# Patient Record
Sex: Male | Born: 1975 | Race: White | Hispanic: No | Marital: Single | Smoking: Never smoker
Health system: Southern US, Community
[De-identification: ages and names within clinical notes are randomized; demographics above are authoritative.]

## PROBLEM LIST (undated history)

## (undated) DIAGNOSIS — K859 Acute pancreatitis without necrosis or infection, unspecified: Secondary | ICD-10-CM

## (undated) DIAGNOSIS — I10 Essential (primary) hypertension: Secondary | ICD-10-CM

## (undated) DIAGNOSIS — A4902 Methicillin resistant Staphylococcus aureus infection, unspecified site: Secondary | ICD-10-CM

## (undated) HISTORY — PX: HIP SURGERY: SHX245

---

## 2017-12-29 ENCOUNTER — Emergency Department (HOSPITAL_BASED_OUTPATIENT_CLINIC_OR_DEPARTMENT_OTHER)
Admission: EM | Admit: 2017-12-29 | Discharge: 2017-12-29 | Disposition: A | Discharge status: Home / Self Care | Payer: Medicaid - Out of State | Attending: Emergency Medicine | Admitting: Emergency Medicine

## 2017-12-29 ENCOUNTER — Encounter (HOSPITAL_BASED_OUTPATIENT_CLINIC_OR_DEPARTMENT_OTHER): Payer: Self-pay | Admitting: *Deleted

## 2017-12-29 ENCOUNTER — Other Ambulatory Visit: Payer: Self-pay

## 2017-12-29 ENCOUNTER — Emergency Department (HOSPITAL_BASED_OUTPATIENT_CLINIC_OR_DEPARTMENT_OTHER): Payer: Medicaid - Out of State

## 2017-12-29 DIAGNOSIS — I10 Essential (primary) hypertension: Secondary | ICD-10-CM | POA: Diagnosis not present

## 2017-12-29 DIAGNOSIS — R1033 Periumbilical pain: Secondary | ICD-10-CM | POA: Diagnosis present

## 2017-12-29 DIAGNOSIS — Z79899 Other long term (current) drug therapy: Secondary | ICD-10-CM | POA: Insufficient documentation

## 2017-12-29 DIAGNOSIS — K852 Alcohol induced acute pancreatitis without necrosis or infection: Secondary | ICD-10-CM | POA: Diagnosis not present

## 2017-12-29 HISTORY — DX: Methicillin resistant Staphylococcus aureus infection, unspecified site: A49.02

## 2017-12-29 HISTORY — DX: Essential (primary) hypertension: I10

## 2017-12-29 HISTORY — DX: Acute pancreatitis without necrosis or infection, unspecified: K85.90

## 2017-12-29 LAB — CBC WITH DIFFERENTIAL/PLATELET
BASOS ABS: 0 10*3/uL (ref 0.0–0.1)
BASOS PCT: 0 %
EOS ABS: 0.1 10*3/uL (ref 0.0–0.7)
Eosinophils Relative: 1 %
HCT: 41.4 % (ref 39.0–52.0)
HEMOGLOBIN: 15 g/dL (ref 13.0–17.0)
LYMPHS ABS: 1.2 10*3/uL (ref 0.7–4.0)
Lymphocytes Relative: 6 %
MCH: 33.4 pg (ref 26.0–34.0)
MCHC: 36.2 g/dL — ABNORMAL HIGH (ref 30.0–36.0)
MCV: 92.2 fL (ref 78.0–100.0)
Monocytes Absolute: 1.1 10*3/uL — ABNORMAL HIGH (ref 0.1–1.0)
Monocytes Relative: 5 %
NEUTROS PCT: 88 %
Neutro Abs: 17.4 10*3/uL — ABNORMAL HIGH (ref 1.7–7.7)
Platelets: 234 10*3/uL (ref 150–400)
RBC: 4.49 MIL/uL (ref 4.22–5.81)
RDW: 14.2 % (ref 11.5–15.5)
WBC: 19.8 10*3/uL — ABNORMAL HIGH (ref 4.0–10.5)

## 2017-12-29 LAB — COMPREHENSIVE METABOLIC PANEL
ALBUMIN: 3.8 g/dL (ref 3.5–5.0)
ALK PHOS: 128 U/L — AB (ref 38–126)
ALT: 110 U/L — AB (ref 17–63)
AST: 85 U/L — ABNORMAL HIGH (ref 15–41)
Anion gap: 16 — ABNORMAL HIGH (ref 5–15)
BUN: 7 mg/dL (ref 6–20)
CALCIUM: 9.1 mg/dL (ref 8.9–10.3)
CO2: 23 mmol/L (ref 22–32)
CREATININE: 0.62 mg/dL (ref 0.61–1.24)
Chloride: 95 mmol/L — ABNORMAL LOW (ref 101–111)
GFR calc Af Amer: >60 mL/min (ref >60)
GFR calc non Af Amer: >60 mL/min (ref >60)
GLUCOSE: 92 mg/dL (ref 65–99)
Potassium: 3.5 mmol/L (ref 3.5–5.1)
SODIUM: 134 mmol/L — AB (ref 135–145)
Total Bilirubin: 1.5 mg/dL — ABNORMAL HIGH (ref 0.3–1.2)
Total Protein: 7.7 g/dL (ref 6.5–8.1)

## 2017-12-29 LAB — LIPASE, BLOOD: Lipase: 112 U/L — ABNORMAL HIGH (ref 11–51)

## 2017-12-29 MED ORDER — IOPAMIDOL (ISOVUE-300) INJECTION 61%
100.0000 mL | Freq: Once | INTRAVENOUS | Status: AC | PRN
  Administered 2017-12-29: 100 mL via INTRAVENOUS

## 2017-12-29 MED ORDER — GI COCKTAIL ~~LOC~~
30.0000 mL | Freq: Once | ORAL | Status: AC
  Administered 2017-12-29: 30 mL via ORAL
  Filled 2017-12-29: qty 30

## 2017-12-29 MED ORDER — OXYCODONE-ACETAMINOPHEN 5-325 MG PO TABS: 1.0000 | ORAL_TABLET | Freq: Four times a day (QID) | ORAL | 0 refills | Status: AC | PRN

## 2017-12-29 MED ORDER — FENTANYL CITRATE (PF) 100 MCG/2ML IJ SOLN
100.0000 ug | Freq: Once | INTRAMUSCULAR | Status: AC
  Administered 2017-12-29: 100 ug via INTRAVENOUS
  Filled 2017-12-29: qty 2

## 2017-12-29 MED ORDER — KETOROLAC TROMETHAMINE 30 MG/ML IJ SOLN
15.0000 mg | Freq: Once | INTRAMUSCULAR | Status: AC
Start: 2017-12-29 — End: 2017-12-29
  Administered 2017-12-29: 15 mg via INTRAVENOUS
  Filled 2017-12-29: qty 1

## 2017-12-29 MED ORDER — ONDANSETRON HCL 4 MG/2ML IJ SOLN
4.0000 mg | Freq: Once | INTRAMUSCULAR | Status: AC
  Administered 2017-12-29: 4 mg via INTRAVENOUS
  Filled 2017-12-29: qty 2

## 2017-12-29 MED ORDER — ONDANSETRON 8 MG PO TBDP: ORAL_TABLET | ORAL | 0 refills | Status: AC

## 2017-12-29 MED ORDER — OMEPRAZOLE 20 MG PO CPDR: 20.0000 mg | DELAYED_RELEASE_CAPSULE | Freq: Every day | ORAL | 0 refills | Status: AC

## 2017-12-29 NOTE — ED Provider Notes (Addendum)
[facility] EMERGENCY DEPARTMENT Provider Note   CSN: [id number] Arrival date & time: 12/29/17  0245     History   Chief Complaint Chief Complaint  Patient presents with  . Abdominal Pain    HPI Samuel Berg is a 42 y.o. male.  The history is provided by the patient. No language interpreter was used.  Abdominal Pain   The current episode started more than 2 days ago. The problem occurs constantly. The problem has not changed since onset.The pain is associated with alcohol use. The pain is located in the periumbilical region. The quality of the pain is sharp. The pain is at a severity of 10/10. The pain is severe. Associated symptoms include nausea and vomiting. Pertinent negatives include fever and diarrhea. Nothing aggravates the symptoms. Nothing relieves the symptoms. Past workup does not include CT scan. His past medical history does not include PUD or ulcerative colitis.    Past Medical History:  Diagnosis Date  . Hypertension   . MRSA infection   . Pancreatitis     There are no active problems to display for this patient.   Past Surgical History:  Procedure Laterality Date  . HIP SURGERY          Home Medications    Prior to Admission medications   Medication Sig Start Date End Date Taking? Authorizing Provider  Atorvastatin Calcium (LIPITOR PO) Take by mouth.   Yes [provider]  Esomeprazole Magnesium (NEXIUM PO) Take by mouth.   Yes [provider]  LISINOPRIL PO Take by mouth.   Yes [provider]    Family History No family history on file.  Social History Social History   Tobacco Use  . Smoking status: Never Smoker  . Smokeless tobacco: Never Used  Substance Use Topics  . Alcohol use: Yes    Comment: daily   . Drug use: Never     Allergies   Patient has no known allergies.   Review of Systems Review of Systems  Constitutional: Negative for diaphoresis and fever.  Respiratory: Negative for  shortness of breath.   Cardiovascular: Negative for chest pain, palpitations and leg swelling.  Gastrointestinal: Positive for abdominal pain, nausea and vomiting. Negative for blood in stool and diarrhea.  Genitourinary: Negative for flank pain.  All other systems reviewed and are negative.    Physical Exam Updated Vital Signs BP 136/90   Pulse 86   Temp 100 F (37.8 C) (Oral)   Resp 20   Ht 5\' 9"  (1.753 m)   Wt 104.3 kg (230 lb)   SpO2 97%   BMI 33.97 kg/m   Physical Exam  Constitutional: He is oriented to person, place, and time. He appears well-developed and well-nourished. He does not appear ill. No distress.  HENT:  Head: Normocephalic and atraumatic.  Nose: Nose normal.  Mouth/Throat: No oropharyngeal exudate.  Eyes: Pupils are equal, round, and reactive to light. Conjunctivae are normal.  Neck: Normal range of motion. Neck supple. No JVD present.  Cardiovascular: Normal rate, regular rhythm, normal heart sounds and intact distal pulses.  Pulmonary/Chest: Effort normal and breath sounds normal. No stridor. He has no wheezes. He has no rales.  Abdominal: Soft. Bowel sounds are normal. He exhibits no distension and no mass. There is no tenderness. There is no rebound and no guarding. No hernia.  Musculoskeletal: Normal range of motion.  Neurological: He is alert and oriented to person, place, and time. He displays normal reflexes. He exhibits normal muscle  tone. Coordination normal.  Skin: Skin is warm and dry. Capillary refill takes less than 2 seconds.  Psychiatric: He has a normal mood and affect.     ED Treatments / Results  Labs (all labs ordered are listed, but only abnormal results are displayed) Results for orders placed or performed during the hospital encounter of 12/29/17  CBC with Differential  Result Value Ref Range   WBC 19.8 (H) 4.0 - 10.5 K/uL   RBC 4.49 4.22 - 5.81 MIL/uL   Hemoglobin 15.0 13.0 - 17.0 g/dL   HCT 96.041.4 45.439.0 - 09.852.0 %   MCV 92.2 78.0  - 100.0 fL   MCH 33.4 26.0 - 34.0 pg   MCHC 36.2 (H) 30.0 - 36.0 g/dL   RDW 11.914.2 14.711.5 - 82.915.5 %   Platelets 234 150 - 400 K/uL   Neutrophils Relative % 88 %   Neutro Abs 17.4 (H) 1.7 - 7.7 K/uL   Lymphocytes Relative 6 %   Lymphs Abs 1.2 0.7 - 4.0 K/uL   Monocytes Relative 5 %   Monocytes Absolute 1.1 (H) 0.1 - 1.0 K/uL   Eosinophils Relative 1 %   Eosinophils Absolute 0.1 0.0 - 0.7 K/uL   Basophils Relative 0 %   Basophils Absolute 0.0 0.0 - 0.1 K/uL  Comprehensive metabolic panel  Result Value Ref Range   Sodium 134 (L) 135 - 145 mmol/L   Potassium 3.5 3.5 - 5.1 mmol/L   Chloride 95 (L) 101 - 111 mmol/L   CO2 23 22 - 32 mmol/L   Glucose, Bld 92 65 - 99 mg/dL   BUN 7 6 - 20 mg/dL   Creatinine, Ser 5.620.62 0.61 - 1.24 mg/dL   Calcium 9.1 8.9 - 13.010.3 mg/dL   Total Protein 7.7 6.5 - 8.1 g/dL   Albumin 3.8 3.5 - 5.0 g/dL   AST 85 (H) 15 - 41 U/L   ALT 110 (H) 17 - 63 U/L   Alkaline Phosphatase 128 (H) 38 - 126 U/L   Total Bilirubin 1.5 (H) 0.3 - 1.2 mg/dL   GFR calc non Af Amer >60 >60 mL/min   GFR calc Af Amer >60 >60 mL/min   Anion gap 16 (H) 5 - 15  Lipase, blood  Result Value Ref Range   Lipase 112 (H) 11 - 51 U/L   Ct Abdomen Pelvis W Contrast  Result Date: 12/29/2017 CLINICAL DATA:  Vomiting for 3 days, upper abdominal pain. History of pancreatitis. Recent alcohol consumption. EXAM: CT ABDOMEN AND PELVIS WITH CONTRAST TECHNIQUE: Multidetector CT imaging of the abdomen and pelvis was performed using the standard protocol following bolus administration of intravenous contrast. CONTRAST:  100mL ISOVUE-300 IOPAMIDOL (ISOVUE-300) INJECTION 61% COMPARISON:  None. FINDINGS: LOWER CHEST: Lung bases are clear. Included heart size is normal. No pericardial effusion. HEPATOBILIARY: Diffusely hypodense liver is otherwise normal. Normal gallbladder. PANCREAS: Peripancreatic fat stranding. No focal necrosis, duct dilatation, mass, or fluid collections. Calcifications uncinate process. SPLEEN:  Normal. ADRENALS/URINARY TRACT: Kidneys are orthotopic, demonstrating symmetric enhancement. No nephrolithiasis, hydronephrosis or solid renal masses. The unopacified ureters are normal in course and caliber. Delayed imaging through the kidneys demonstrates symmetric prompt contrast excretion within the proximal urinary collecting system. Urinary bladder is partially distended and unremarkable. Normal adrenal glands. STOMACH/BOWEL: The stomach, small and large bowel are normal in course and caliber without inflammatory changes. Normal appendix. VASCULAR/LYMPHATIC: Aortoiliac vessels are normal in course and caliber. No lymphadenopathy by CT size criteria. REPRODUCTIVE: Nonsuspicious though predominately obscured. OTHER: Small volume ascites and  retroperitoneal effusion. No drainable fluid collection. No intraperitoneal free air. MUSCULOSKELETAL: Nonacute. 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 Mild degenerative change of the spine. IMPRESSION: 1. Acute pancreatitis without necrosis or pseudocyst. 2. Hepatic steatosis. Electronically Signed   By: Awilda Metro M.D.   On: 12/29/2017 06:18   EKG None  Radiology Ct Abdomen Pelvis W Contrast  Result Date: 12/29/2017 CLINICAL DATA:  Vomiting for 3 days, upper abdominal pain. History of pancreatitis. Recent alcohol consumption. EXAM: CT ABDOMEN AND PELVIS WITH CONTRAST TECHNIQUE: Multidetector CT imaging of the abdomen and pelvis was performed using the standard protocol following bolus administration of intravenous contrast. CONTRAST:  ISOVUE-300 IOPAMIDOL (ISOVUE-300) INJECTION 61% COMPARISON:  None. FINDINGS: LOWER CHEST: Lung bases are clear. Included heart size is normal. No pericardial effusion. HEPATOBILIARY: Diffusely hypodense liver is otherwise normal. Normal gallbladder. PANCREAS: Peripancreatic fat stranding. No focal necrosis, duct dilatation, mass, or fluid collections. Calcifications uncinate process. SPLEEN: Normal.  ADRENALS/URINARY TRACT: Kidneys are orthotopic, demonstrating symmetric enhancement. No nephrolithiasis, hydronephrosis or solid renal masses. The unopacified ureters are normal in course and caliber. Delayed imaging through the kidneys demonstrates symmetric prompt contrast excretion within the proximal urinary collecting system. Urinary bladder is partially distended and unremarkable. Normal adrenal glands. STOMACH/BOWEL: The stomach, small and large bowel are normal in course and caliber without inflammatory changes. Normal appendix. VASCULAR/LYMPHATIC: Aortoiliac vessels are normal in course and caliber. No lymphadenopathy by CT size criteria. REPRODUCTIVE: Nonsuspicious though predominately obscured. OTHER: Small volume ascites and retroperitoneal effusion. No drainable fluid collection. No intraperitoneal free air. MUSCULOSKELETAL: Nonacute. 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 Mild degenerative change of the spine. IMPRESSION: 1. Acute pancreatitis without necrosis or pseudocyst. 2. Hepatic steatosis. Electronically Signed   By: Awilda Metro M.D.   On: 12/29/2017 06:18    Procedures Procedures (including critical care time)  Medications Ordered in ED Medications  iopamidol (ISOVUE-300) 61 % injection 100 mL (100 mLs Intravenous Contrast Given 12/29/17 0544)  fentaNYL (SUBLIMAZE) injection 100 mcg (100 mcg Intravenous Given 12/29/17 0611)  ondansetron (ZOFRAN) injection 4 mg (4 mg Intravenous Given 12/29/17 0614)  gi cocktail (Maalox,Lidocaine,Donnatal) (30 mLs Oral Given 12/29/17 0644)  ketorolac (TORADOL) 30 MG/ML injection 15 mg (15 mg Intravenous Given 12/29/17 0643)     White count is from demargination from emesis.  No more alcohol. No driving, no drinking no operating heavy machinery while taking narcotic pain medication.    Final Clinical Impressions(s) / ED Diagnoses   Return for weakness, numbness, changes in vision or speech, fevers >100.4 unrelieved by  medication, shortness of breath, intractable vomiting, or diarrhea, abdominal pain, Inability to tolerate liquids or food, cough, altered mental status or any concerns. No signs of systemic illness or infection. The patient is nontoxic-appearing on exam and vital signs are within normal limits.   I have reviewed the triage vital signs and the nursing notes. Pertinent labs &imaging results that were available during my care of the patient were reviewed by me and considered in my medical decision making (see chart for details).  After history, exam, and medical workup I feel the patient has been appropriately medically screened and is safe for discharge home. Pertinent diagnoses were discussed with the patient. Patient was given return precautions.      Samuel Sabol, MD 12/29/17 Laureen Abrahams, Wynona Duhamel, MD 12/29/17 1610

## 2017-12-29 NOTE — ED Notes (Signed)
Alert, NAD, calm, interactive, resps e/u, speaking in clear complete sentences, no dyspnea noted, skin W&D, VSS, c/o abd pain, also NV, relates to pancreatitis and drinking ETOH daily and more than usual this weekend, (denies: sob, fever, diarrhea, dizziness or visual changes). States, last flare up was ~ 2 years ago. Only vomit if provoked by eating/drinking.

## 2017-12-29 NOTE — ED Notes (Signed)
Resting comfortably, no changes, alert, NAD, calm, interactive, no dyspnea, to CT via stretcher.

## 2017-12-29 NOTE — ED Notes (Signed)
EDP into room, prior to RN assessment, see MD notes, pending orders.   

## 2017-12-29 NOTE — ED Notes (Signed)
EDP into room, pt updated on results and plan. Pt to call for ride.

## 2017-12-29 NOTE — ED Triage Notes (Signed)
C/o vomiting since Thursday. C/o upper abd pain. States he has a hx of pancreatitis and drank etoh last weekend. Describes as a constant aching type pain.

## 2018-10-13 IMAGING — CT CT ABD-PELV W/ CM
2 of 5 series · 16 of 46 positions shown, 18 images · IV contrast (iopamidol)
Comparison: None.

CLINICAL DATA: Vomiting for 3 days, upper abdominal pain. History
of pancreatitis. Recent alcohol consumption.

EXAM:
CT ABDOMEN AND PELVIS WITH CONTRAST
TECHNIQUE: Multidetector CT imaging of the abdomen and pelvis was performed
using the standard protocol following bolus administration of
intravenous contrast.
CONTRAST:  100mL 7RDQ4J-3BB IOPAMIDOL ([id number]) INJECTION 61%

[Series 2: axial st · axial · 0.82mm/px · z∈[+770,+1225]mm · 13 of 103 slices shown, 15 images]
[im 6/103  soft-tissue]
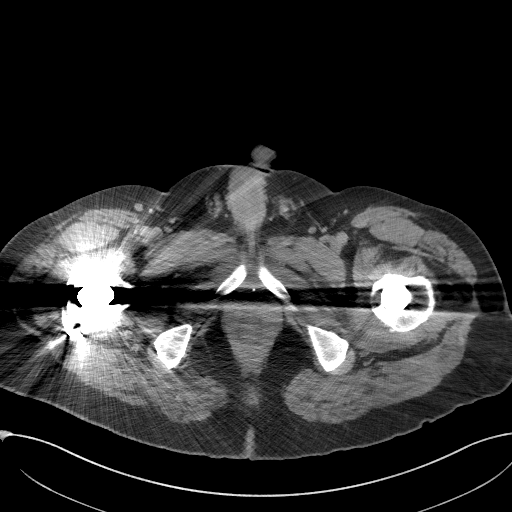
[im 6/103  bone]
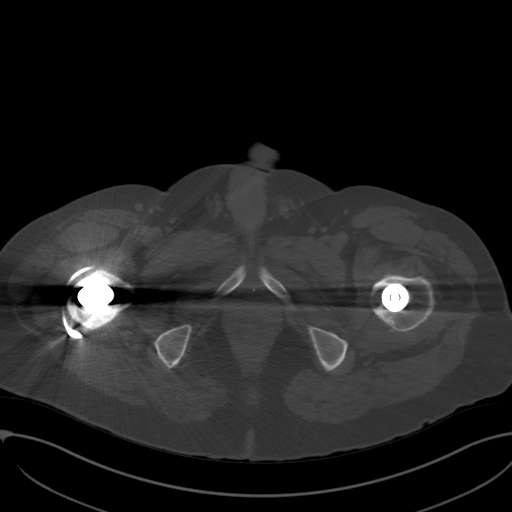
[im 12/103  soft-tissue]
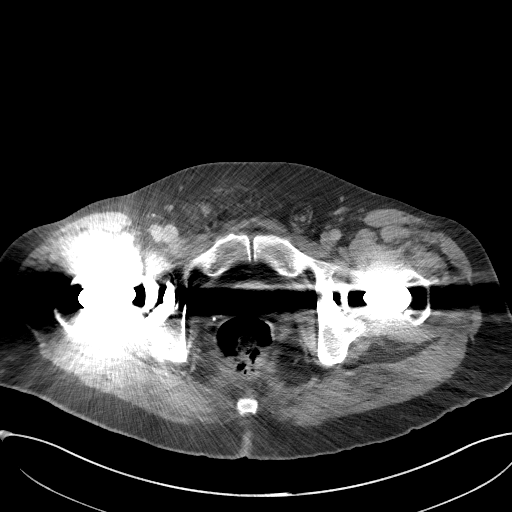
[im 23/103  soft-tissue]
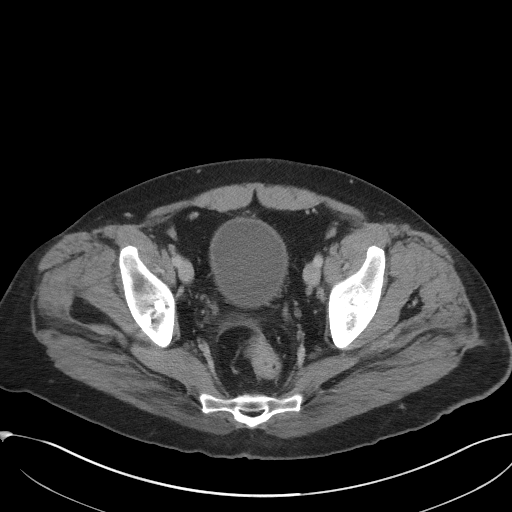
[im 29/103  soft-tissue]
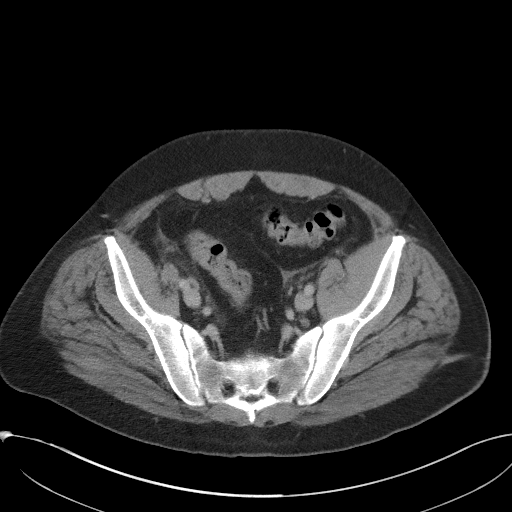
[im 35/103  soft-tissue]
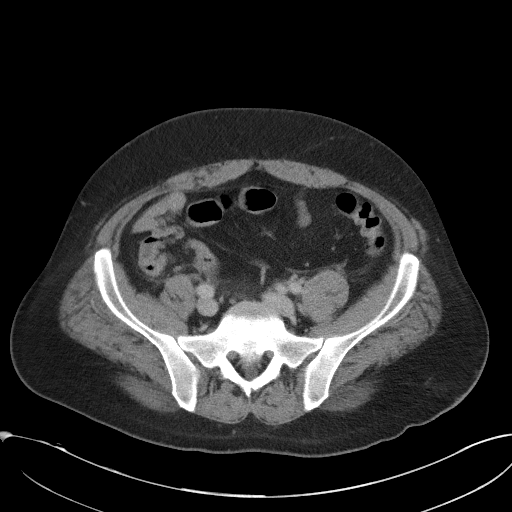
[im 46/103  soft-tissue]
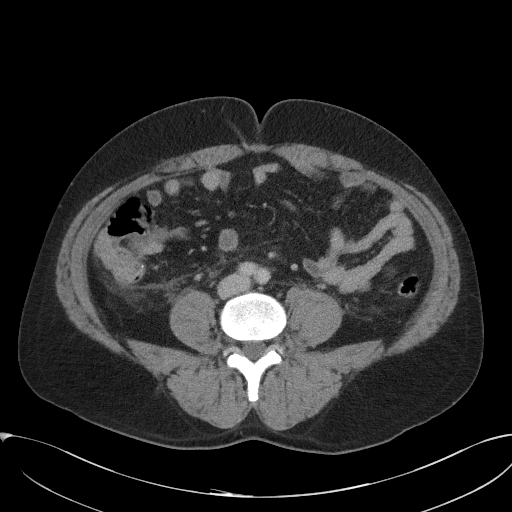
[im 52/103  soft-tissue]
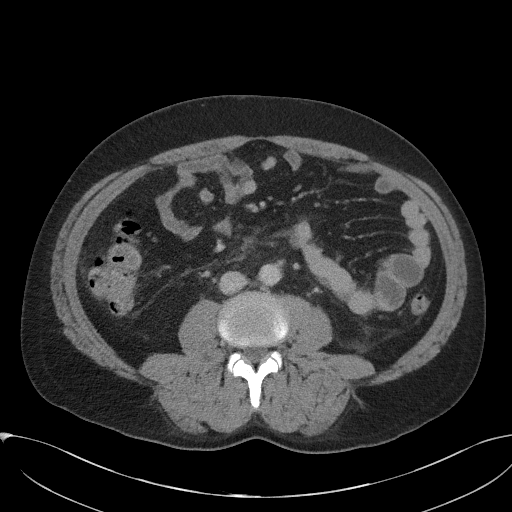
[im 57/103  soft-tissue]
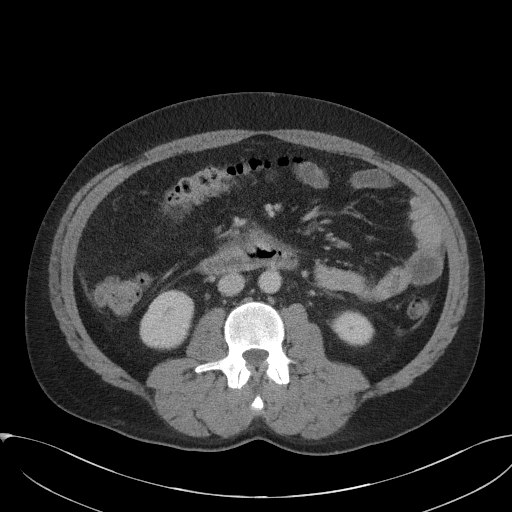
[im 69/103  soft-tissue]
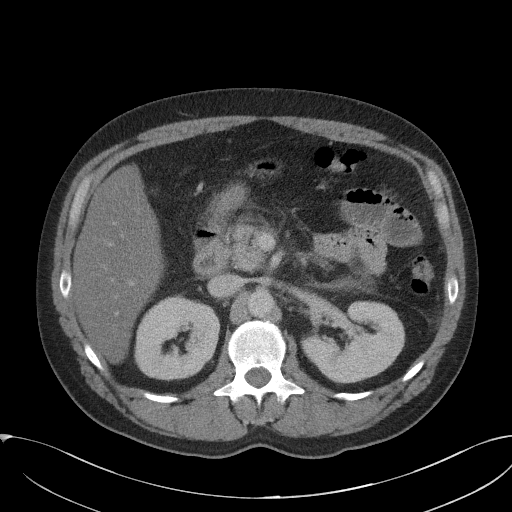
[im 69/103  bone]
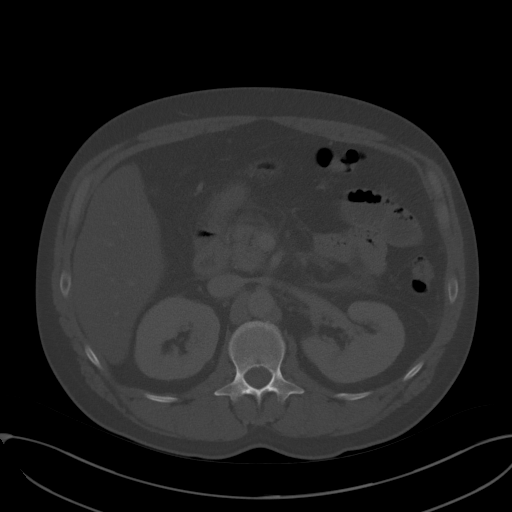
[im 74/103  soft-tissue]
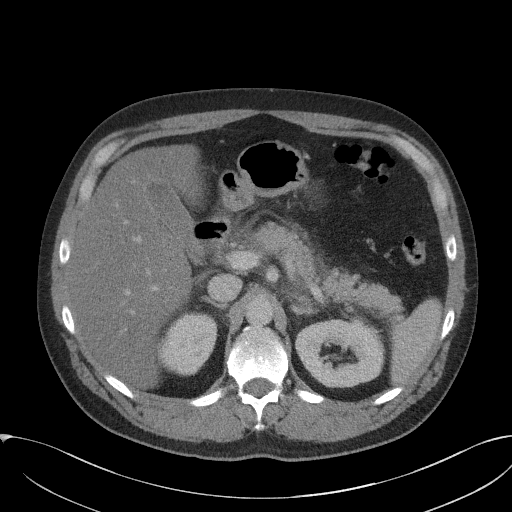
[im 80/103  soft-tissue]
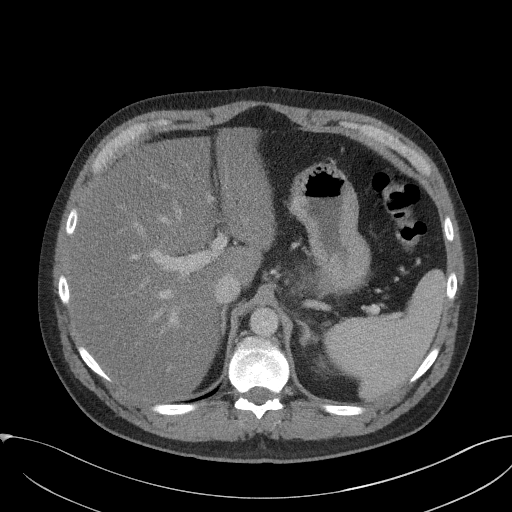
[im 91/103  soft-tissue]
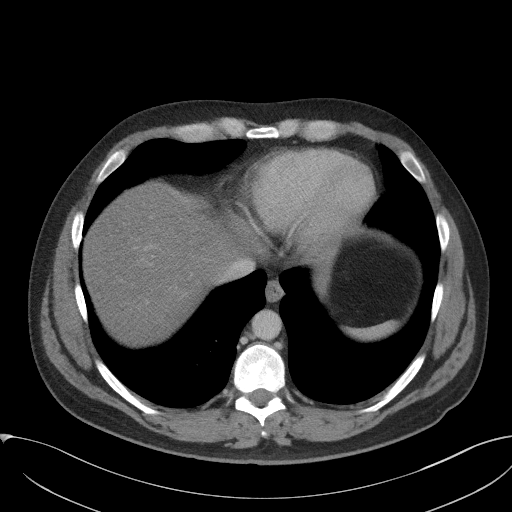
[im 97/103  soft-tissue]
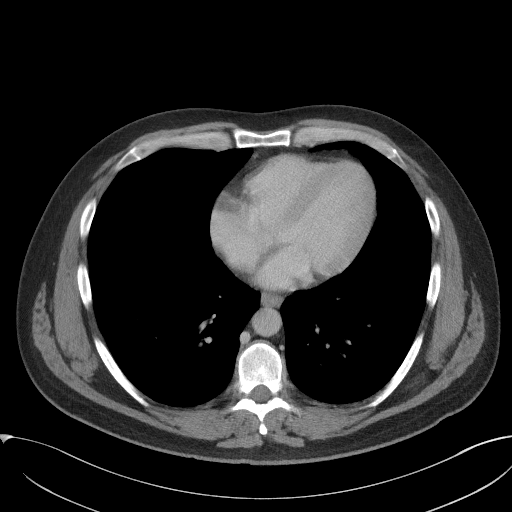

[Series 5: coronal st · coronal · 0.83mm/px · 3 of 107 slices shown]
[im 36/107  soft-tissue]
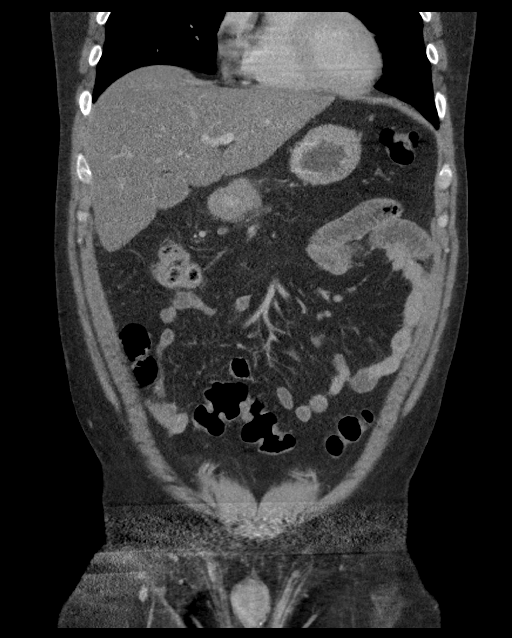
[im 48/107  soft-tissue]
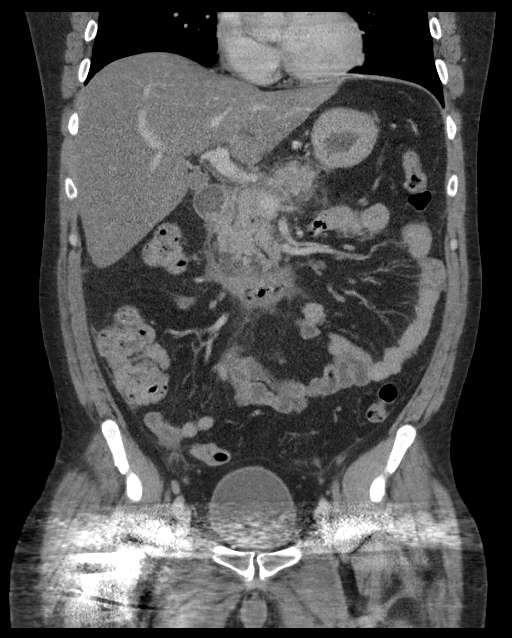
[im 59/107  soft-tissue]
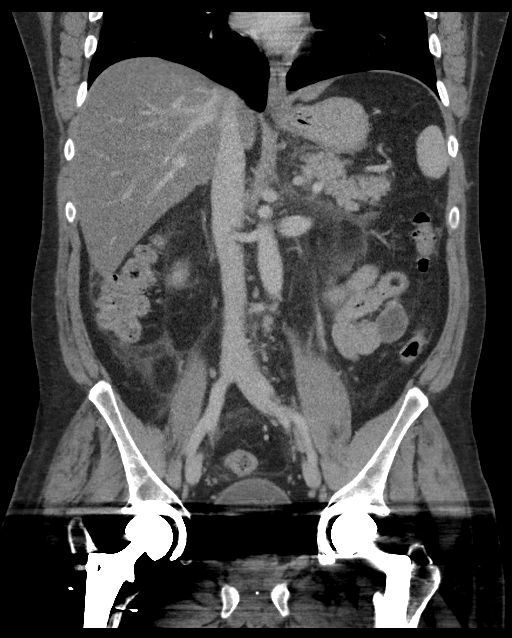

[16 of 46 positions shown; findings below may reference images not displayed]

FINDINGS: LOWER CHEST: Lung bases are clear. Included heart size is normal. No
pericardial effusion.

HEPATOBILIARY: Diffusely hypodense liver is otherwise normal. Normal
gallbladder.

PANCREAS: Peripancreatic fat stranding. No focal necrosis, duct
dilatation, mass, or fluid collections. Calcifications uncinate
process.

SPLEEN: Normal.

ADRENALS/URINARY TRACT: Kidneys are orthotopic, demonstrating
symmetric enhancement. No nephrolithiasis, hydronephrosis or solid
renal masses. The unopacified ureters are normal in course and
caliber. Delayed imaging through the kidneys demonstrates symmetric
prompt contrast excretion within the proximal urinary collecting
system. Urinary bladder is partially distended and unremarkable.
Normal adrenal glands.

STOMACH/BOWEL: The stomach, small and large bowel are normal in
course and caliber without inflammatory changes. Normal appendix.

VASCULAR/LYMPHATIC: Aortoiliac vessels are normal in course and
caliber. No lymphadenopathy by CT size criteria.

REPRODUCTIVE: Nonsuspicious though predominately obscured.

OTHER: Small volume ascites and retroperitoneal effusion. No
drainable fluid collection. No intraperitoneal free air.

MUSCULOSKELETAL: Nonacute. Streak artifact from bilateral hip total
arthroplasties. Mild degenerative change of the spine.
IMPRESSION: 1. Acute pancreatitis without necrosis or pseudocyst.
2. Hepatic steatosis.
